# Patient Record
Sex: Male | Born: 1960 | Race: White | Hispanic: No | Marital: Married | Smoking: Former smoker
Health system: Southern US, Community
[De-identification: ages and names within clinical notes are randomized; demographics above are authoritative.]

## PROBLEM LIST (undated history)

## (undated) HISTORY — PX: FINGER SURGERY: SHX640

---

## 2007-07-23 ENCOUNTER — Observation Stay (HOSPITAL_COMMUNITY): Admission: EM | Admit: 2007-07-23 | Discharge: 2007-07-24 | Payer: Self-pay | Admitting: Family Medicine

## 2007-07-23 ENCOUNTER — Ambulatory Visit: Payer: Self-pay | Admitting: Family Medicine

## 2009-08-05 IMAGING — CT CT ABDOMEN W/ CM
2 of 5 series · 16 of 46 positions shown, 18 images · IV contrast (APPLIED)
Comparison: None

CT ABDOMEN

CLINICAL DATA: Abdominal cramping.  Elevated white count.  Left
lower quadrant pain.  Nausea, vomiting, diaphoresis.  No prior
surgery.

CT ABDOMEN AND PELVIS WITH CONTRAST
TECHNIQUE: Multidetector CT imaging of the abdomen and pelvis was
performed using the standard protocol following bolus
administration of intravenous contrast.
Contrast: 100 ml Gmnipaque-IVV.

[Series 2: abd/pelv with 5.0 b31f st · axial · 0.71mm/px · z∈[-472,-48]mm · 13 of 95 slices shown, 15 images]
[im 5/95  soft-tissue]
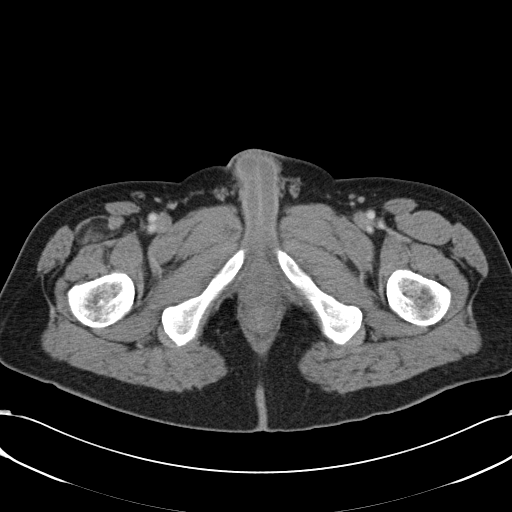
[im 5/95  bone]
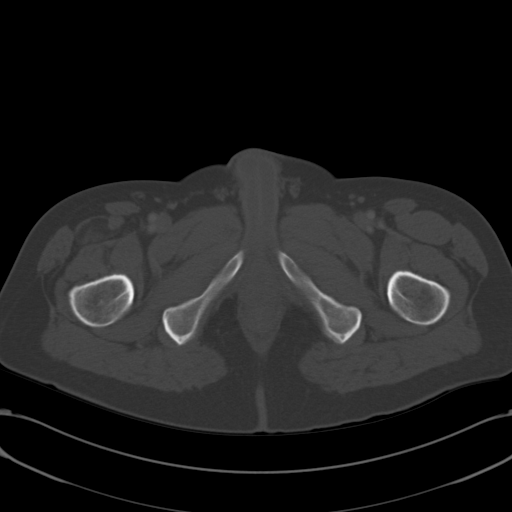
[im 15/95  soft-tissue]
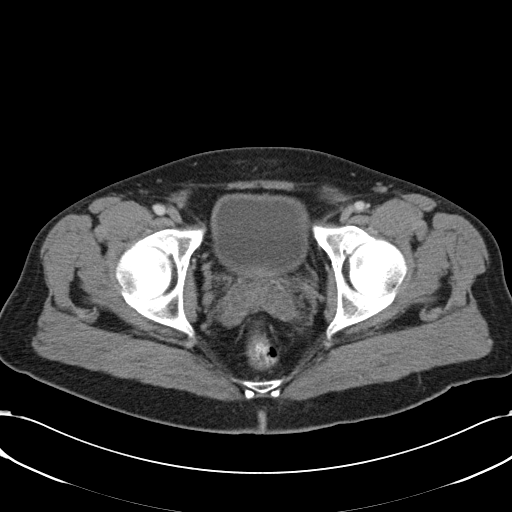
[im 19/95  soft-tissue]
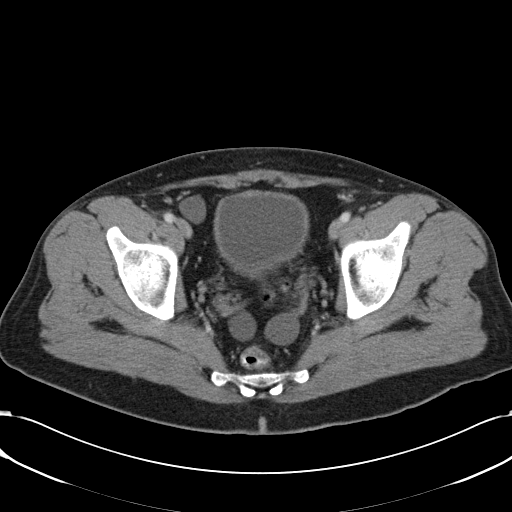
[im 29/95  soft-tissue]
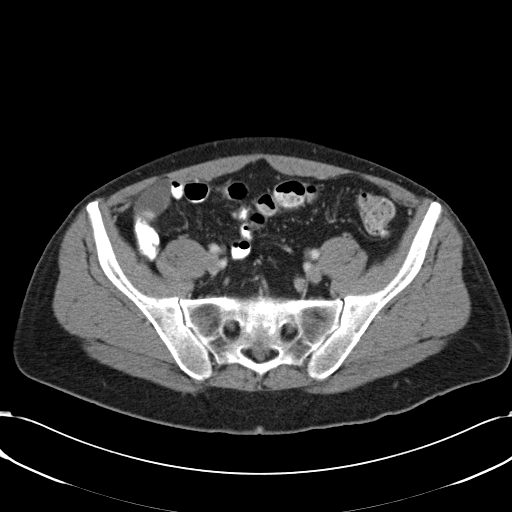
[im 33/95  soft-tissue]
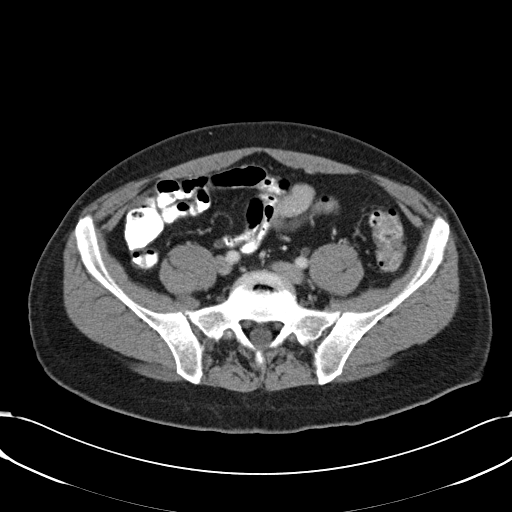
[im 43/95  soft-tissue]
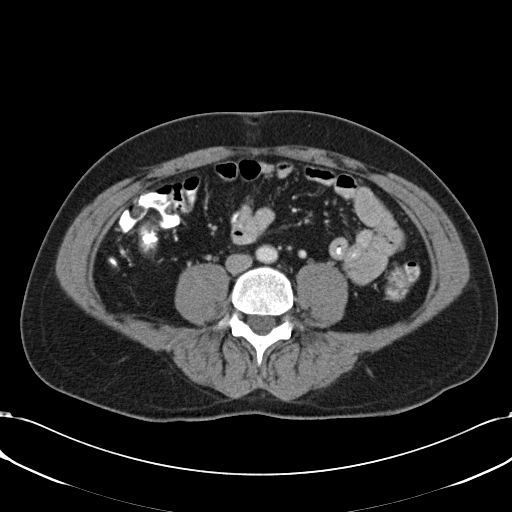
[im 48/95  soft-tissue]
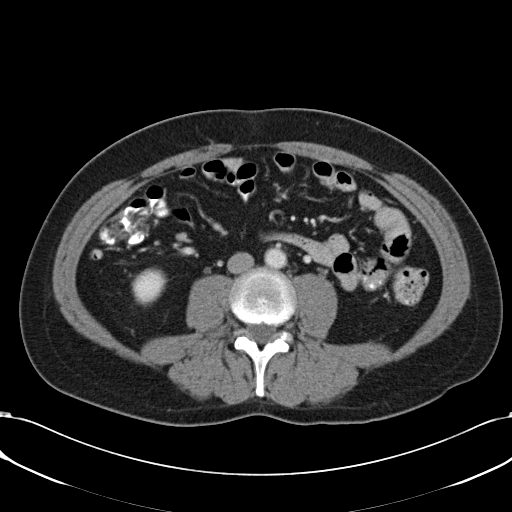
[im 52/95  soft-tissue]
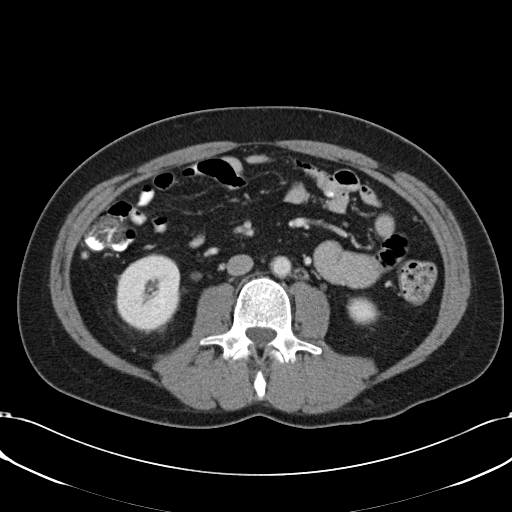
[im 62/95  soft-tissue]
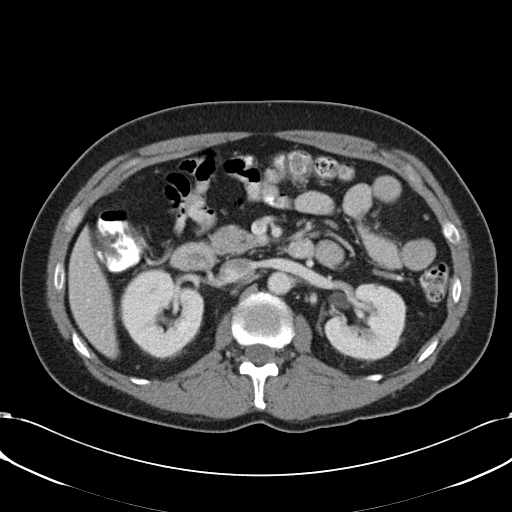
[im 62/95  bone]
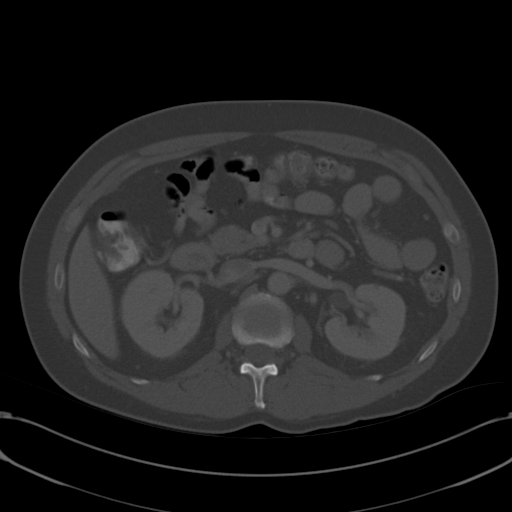
[im 66/95  soft-tissue]
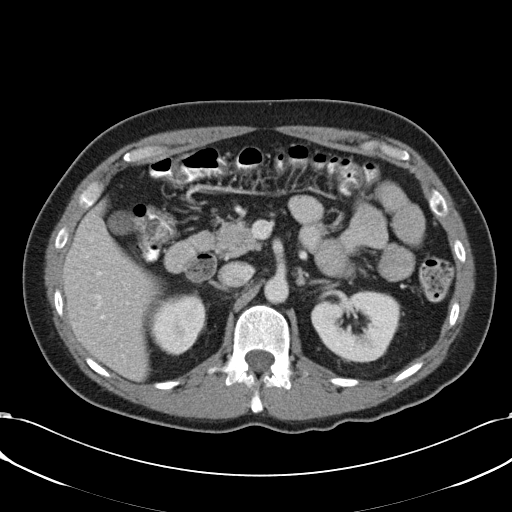
[im 76/95  soft-tissue]
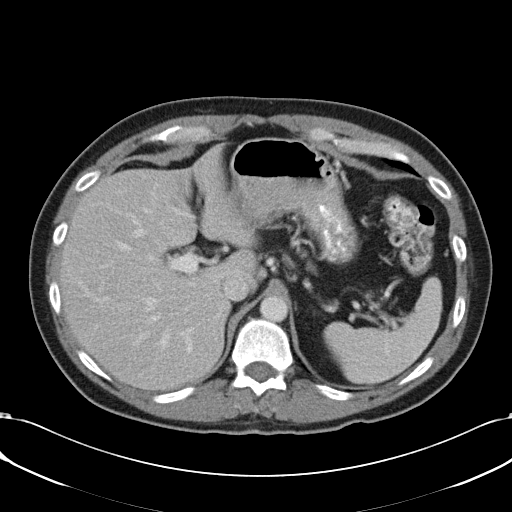
[im 80/95  soft-tissue]
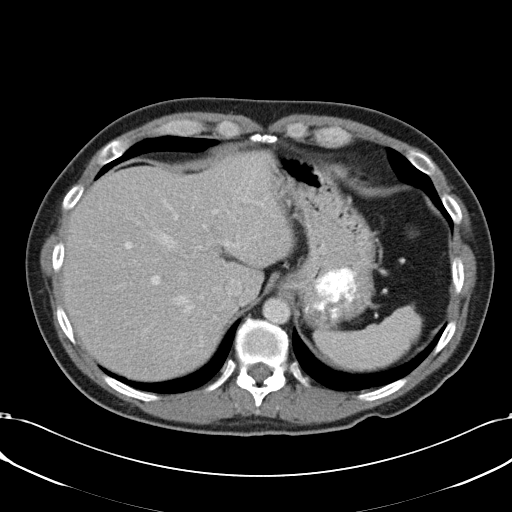
[im 90/95  soft-tissue]
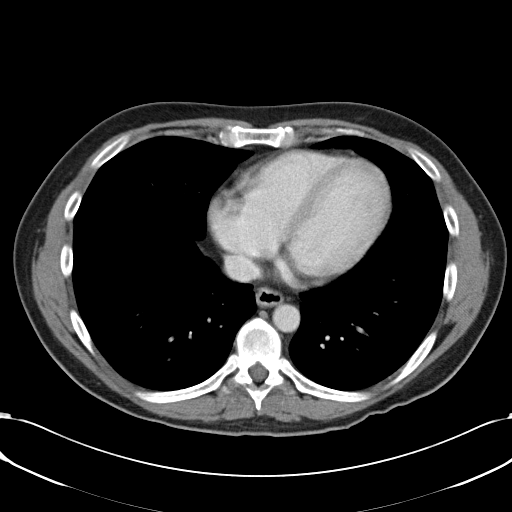

[Series 5: abd/pelv with 4.0 spo cor st · coronal · 0.93mm/px · 3 of 52 slices shown]
[im 18/52  soft-tissue]
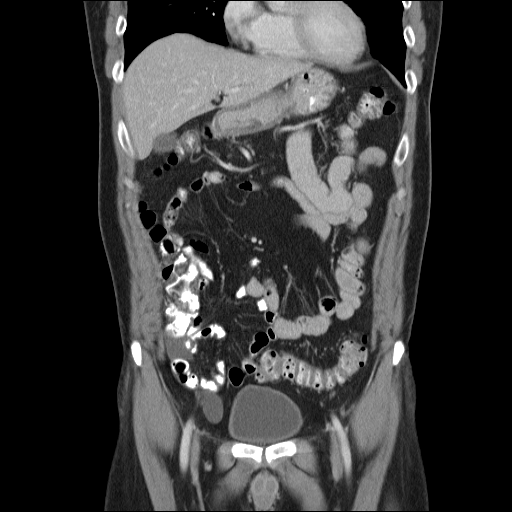
[im 23/52  soft-tissue]
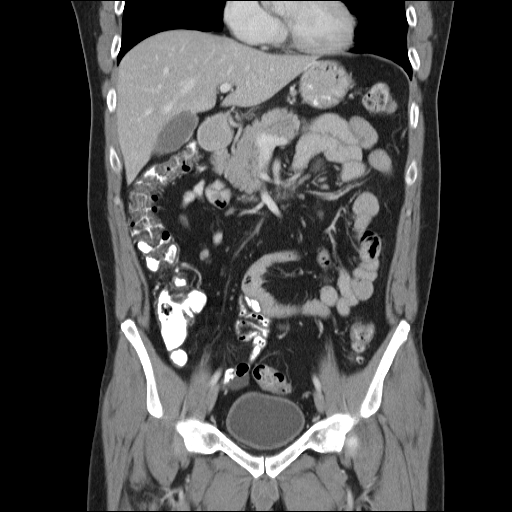
[im 29/52  soft-tissue]
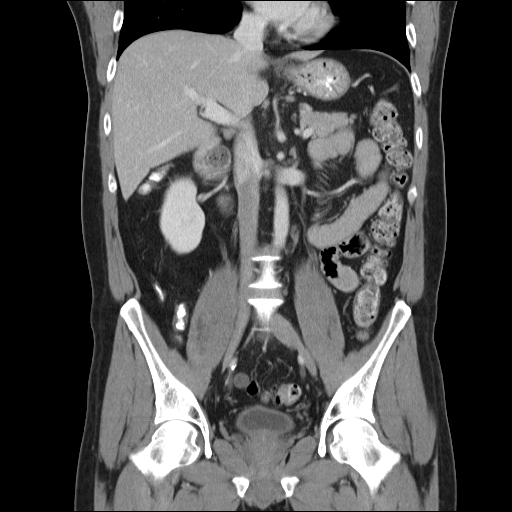

[16 of 46 positions shown; findings below may reference images not displayed]

FINDINGS: Images of the lung bases are unremarkable.  No focal
abnormality is identified within the liver, spleen, pancreas,
adrenal glands, or kidneys.  The appendix is well seen and has a
normal appearance.  Gallbladder is present.  There is no
retroperitoneal adenopathy.
IMPRESSION: No evidence for acute abnormality of the abdomen

CT PELVIS
FINDINGS: Throughout the pelvis, there are multiple well-defined
collections of low attenuation fluid.  Versus 4.0 x 2.0 cm in the
region of the terminal ileum.  This is clearly separate from the
appendix.  A second is seen in the region of the right pelvic side
wall and measures 2.2 x 5.1 cm.  Three additional similar
collections are identified in the central posterior pelvis,
measuring 2.6, 2.8, and 1.8 cm.  These are in close proximity to
the seminal vesicles but appears similar to the other lesions which
are not adjacent to the seminal vesicles.  The patient has had
numerous diverticula but these collections of fluid are not in
close proximity to diverticular disease to suggest that they
represent diverticular abscess.  There is no inflammatory change to
suggest that these are related to abscess these of other origin.
The prostate appears enlarged.  There is no pelvic adenopathy.
IMPRESSION: A discrete simple appearing fluid collections within the pelvis as
described.  The etiology of these is unknown.  These are felt to be
related to peritoneal inclusion cysts, lymphoceles, which are more
common in patients with prior surgery.  Seminal vesicle cysts
representing abscesses.

## 2010-06-03 NOTE — Discharge Summary (Signed)
Joe Cross, Joe Cross               ACCOUNT NO.:  [id number]   MEDICAL RECORD NO.:  [id number]          PATIENT TYPE:  INP   LOCATION:  5123                         FACILITY:  [facility]   PHYSICIAN:  Leighton Roach McDiarmid, M.D.DATE OF BIRTH:  Aug 03, 1960   DATE OF ADMISSION:  07/23/2007  DATE OF DISCHARGE:  07/24/2007                               DISCHARGE SUMMARY   PRIMARY CARE PHYSICIAN:  [facility].   REASON FOR ADMISSION:  Abdominal pain.   DISCHARGE DIAGNOSES:  1. Abdominal pain, rule out diverticular disease.  2. Borderline hypertension.   DISCHARGE MEDICATIONS:  1. Cipro 500 mg p.o. b.i.d. x7 days.  2. Flagyl 500 mg p.o. t.i.d x7 days.  3. MiraLax 1 capful p.o. daily p.r.n. constipation.   CONSULTS:  None.   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  By CT appearance, the  radiologist did not favor abscesses.  The appendix appeared normal and  the patient did have numerous diverticula, but none of the collections  of fluid were in close proximity to the diverticular disease to suggest  an abscess.  The prostate was enlarged.   LABORATORY DATA:  Comprehensive metabolic panel was within normal limits  with a creatinine of 1.1, total bilirubin 1.0, alkaline phosphatase 59,  AST 41, ALT 30, total protein 5.9, albumin 3.3, and calcium 8.5.  CBC on  admission revealed a white blood cell count of 18.1, hemoglobin 18.0,  hematocrit 52.7, and platelet count 295 with an ANC of 13.3, and after  some IV fluids, trended to a white blood cell count 14.9, hemoglobin  15.1, hematocrit 44.7, and platelet count 251.  Fecal occult blood was  negative.  Lipase was 28.   HOSPITAL COURSE:   Abdominal pain.  The patient was treated initially  with IV antibiotics for presumed diverticulitis.  A CT exam was  inconsistent with this, and he was continued on his medications with  improvement regardless.  His abdominal pain improved significantly  overnight with some IV hydrations as well as his vomiting.  The patient  felt significantly better by the day of discharge and thus he was felt  stable for discharge with close followup with his primary care  physician.  He was continued on his Cipro and Flagyl for concern that  there perhaps could be diverticular disease resulting in these  abscesses.  The thought was that these abscesses persisted where the  patient developed a higher white count on recheck that perhaps these  cysts should be further evaluated with aspirations as an outpatient  procedure.  The patient was in agreement with this plan and felt stable  for discharge.   INSTRUCTIONS AND FOLLOWUP:  The patient has a followup scheduled on the  [facility], phone number [phone] on July 27, 2007, at 1:45 p.m..  He is to follow  low-sodium, heart-healthy diet and has no restrictions  with regard to his activity.  He is to stop smoking, and again as noted  above, have his white blood cell count checked, and consider a repeat CT  of the abdomen in the near future for followup of these cysts.      Ancil Boozer, MD  Electronically Signed      Leighton Roach McDiarmid, M.D.  Electronically Signed    SA/MEDQ  D:  07/24/2007  T:  07/25/2007  Job:  [id number]

## 2010-06-06 NOTE — H&P (Signed)
Joe Cross, Joe Cross               ACCOUNT NO.:  [id number]   MEDICAL RECORD NO.:  [id number]          PATIENT TYPE:  OBV   LOCATION:  5123                         FACILITY:  [facility]   PHYSICIAN:  Cat Ta, MD             DATE OF BIRTH:  [birth year]   DATE OF ADMISSION:  07/23/2007  DATE OF DISCHARGE:  07/24/2007                              HISTORY & PHYSICAL   ADDENDUM   ALLERGIES:  No known allergies.   MEDICATIONS:  Over-the-counter vitamin and over-the-counter pain  medication as needed.      Angeline Slim, MD  Electronically Signed     CT/MEDQ  D:  07/25/2007  T:  07/26/2007  Job:  [id number]

## 2010-06-06 NOTE — H&P (Signed)
NAME:  Joe Cross, Joe Cross               ACCOUNT NO.:  [id number]   MEDICAL RECORD NO.:  [id number]          PATIENT TYPE:  OBV   LOCATION:  5123                         FACILITY:  [facility]   PHYSICIAN:  Leighton Roach McDiarmid, M.D.DATE OF BIRTH:  [birth year]   DATE OF ADMISSION:  07/23/2007  DATE OF DISCHARGE:  07/24/2007                              HISTORY & PHYSICAL   PRIMARY CARE PHYSICIAN:  None.   The patient came to [place] from [facility].   CHIEF COMPLAINT:  Crampy abdominal pain.   HISTORY OF PRESENT ILLNESS:  The patient complains of crampy left upper  abdominal pain that started last Sunday.  He states that he has  productive cough with pinkish bloody discharge.  On Monday, the patient  took magnesium citrate and had a bowel movement that was soft.  The  following day the patient vomited, foul smelling vomitus, 9 times.  The  vomitus was not bloody and brown in color.  The abdominal pain was also  present at this time.  He did not eat or drink anything on Tuesday.  He  felt better on Thursday, but he started having an appetite again on  Friday and was able to hold down yogurt and ginger ale.  He has not had  a bowel movements since Wednesday and he states that usually he has a  bowel movement regularly with 1 bowel movement everyday, also endorses  flatulence.  The patient did take over-the-counter pain medicine called  Pain-Aid, which did help some of the pain.  Denies fever, chills.   PAST SURGICAL HISTORY:  Right index finger surgery as a teenager,  circumcision 22 years ago.   SOCIAL HISTORY:  The patient lives with his wife and mother.  He works  at [employer].  He smokes 1 pack of cigarette a day times 20 years.  Endorses Alcohol, he drinks about a 6-pack of beer a week.  He does not  drink liquor.  He denies recreational drug use.   FAMILY HISTORY:  Denies any medical history on his mother, his father  died of colon cancer at age 53.  His siblings do not have any health  problems.   REVIEW OF SYSTEMS:  Generally, he has some chills, sweats and states he  has lost 10 pounds in this past week.  He also has some vomiting,  abdominal pain.  The other review of systems, see HPI.   PHYSICAL EXAMINATION:  VITAL SIGNS:  Temp 98.7, pulse 86, respiratory  rate 16, blood pressure 141/101, and pulse ox was 96% on room air.  GENERAL:  The patient is in no apparent distress.  He is alert, oriented  x3.  HEENT:  Moist mucous membrane.  CARDIOVASCULAR:  Regular rate and rhythm.  No murmurs.  LUNGS:  Clear to auscultation bilaterally.  No wheezing.  ABDOMEN:  Mild left lower quadrant tenderness to palpation.  No  guarding.  No rebound.  Hypoactive bowel sounds x4.  GU:  No inguinal nodes to palpation.  Hemoccult negative.   ADMISSION LABS:  Sodium 134, potassium 3.4, chloride 91, bicarb 33, BUN  24,  creatinine 1.23, and glucose 111.  Total bilirubin 1.5, alkaline  phosphatase 77, AST 41, ALT 32, total protein 7.7, albumin 4.3, and  calcium 9.5.  CBC with white blood cell 18.1, hemoglobin 18.0,  hematocrit 52.7, and platelets 295 with 74% neutrophils, 15% lymphocyte,  11% monocytes, eosinophil 1%, and basophil 0.  Lipase 28, hematocrit  negative.   ASSESSMENT/PLAN:  This is a 50 year old Caucasian male with left lower  quadrant pain x1 week and vomiting x2 days.  The differential for  abdominal pain include diverticulitis, abscess, pancreatitis,  obstruction, PUD.  Given increased white blood cell may be  diverticulitis, abscess, or peritonitis.   1. Abdominal pain with creatinine of 1.23.  We decided to order a CT      of the abdomen and pelvis with p.o. and IV contrast.  The patient      is n.p.o. for now.  Empiric antibiotics was given with Cipro and      Flagyl.  CBC in the morning.   1. Decreased potassium, K-Dur 40 mEq p.o. x1.  We will follow BMET in      morning.   1. Creatinine is 1.23.  The patient has been vomiting and had      decreased appetite and  liquid/food consumption.  We do not know his      baseline.  He may be dehydrated, 1 L bolus now and D5 with 1/2 NS      at 125 mL per hour.  BMET in morning.   1. Increased white count.  The patient has been afebrile, elevated      white blood cell may be due in part to dehydration status, but with      the number of 18.1 infection/inflammation are differentials.  I      have given him Flagyl to cover for diverticulitis, abscess,      peritonitis, anaerobic causes.  CBC in a.m.   1. Increased blood pressure.  The patient may have hypertension, but      he has not been seen and/or diagnosed for this condition.  We will      monitor his blood pressure overnight and treat accordingly.   1. FEN/GI.  1 L NS bolus now.  Then D5 1/2 NS at 125cc/hr.  NPO until      abdominal CT.   1. Prophylaxis.  Early ambulation.   1. Disposition.  Pending abdominal CT and labs.      Angeline Slim, MD  Electronically Signed      Leighton Roach McDiarmid, M.D.  Electronically Signed    CT/MEDQ  D:  07/25/2007  T:  07/26/2007  Job:  [id number]

## 2010-10-16 LAB — COMPREHENSIVE METABOLIC PANEL
ALT: 32
Albumin: 3.3 — ABNORMAL LOW
Albumin: 4.3
Alkaline Phosphatase: 77
BUN: 17
Chloride: 101
Creatinine, Ser: 1.1
Glucose, Bld: 112 — ABNORMAL HIGH
Potassium: 3.4 — ABNORMAL LOW
Sodium: 134 — ABNORMAL LOW
Total Bilirubin: 1
Total Protein: 7.7

## 2010-10-16 LAB — CBC
HCT: 44.7
MCV: 94.7
Platelets: 251
Platelets: 295
RDW: 12.4
RDW: 12.8
WBC: 14.9 — ABNORMAL HIGH

## 2010-10-16 LAB — DIFFERENTIAL
Basophils Relative: 0
Eosinophils Absolute: 0.1
Monocytes Absolute: 2 — ABNORMAL HIGH
Monocytes Relative: 11

## 2011-08-16 ENCOUNTER — Ambulatory Visit: Payer: Self-pay | Admitting: Family Medicine

## 2011-08-16 VITALS — BP 138/96 | HR 94 | Temp 98.3°F | Resp 16 | Ht 68.0 in | Wt 179.0 lb

## 2011-08-16 DIAGNOSIS — R21 Rash and other nonspecific skin eruption: Secondary | ICD-10-CM

## 2011-08-16 DIAGNOSIS — L0291 Cutaneous abscess, unspecified: Secondary | ICD-10-CM

## 2011-08-16 DIAGNOSIS — L0390 Cellulitis, unspecified: Secondary | ICD-10-CM

## 2011-08-16 MED ORDER — METHYLPREDNISOLONE SODIUM SUCC 125 MG IJ SOLR
125.0000 mg | Freq: Once | INTRAMUSCULAR | Status: AC
  Administered 2011-08-16: 125 mg via INTRAMUSCULAR

## 2011-08-16 MED ORDER — CEPHALEXIN 500 MG PO CAPS: 500.0000 mg | ORAL_CAPSULE | Freq: Four times a day (QID) | ORAL | Status: AC

## 2011-08-16 MED ORDER — METHYLPREDNISOLONE 4 MG PO KIT: PACK | ORAL | Status: AC

## 2011-08-16 NOTE — Progress Notes (Signed)
[facility]:  Office Visit  Chief Complaint:  Chief Complaint  Patient presents with  . Rash    * 4 days- Bil arms, Bil legs, and torso    HPI: Joe Cross is a 51 y.o. male who complains of  4 day h/o rash which started on his left arm and now has srpead. Worked in pool and cleared some bushes. Tried cortisone OTC and abx cream without releif. Spreading very fast. No CP. No SOB.   History reviewed. No pertinent past medical history. Past Surgical History  Procedure Date  . Finger surgery    History   Social History  . Marital Status: Married    Spouse Name: N/A    Number of Children: N/A  . Years of Education: N/A   Social History Main Topics  . Smoking status: Former Smoker    Quit date: 08/16/2007  . Smokeless tobacco: None  . Alcohol Use: None  . Drug Use: None  . Sexually Active: None   Other Topics Concern  . None   Social History Narrative  . None   Family History  Problem Relation Age of Onset  . Cancer Mother    No Known Allergies Prior to Admission medications   Not on File     ROS: The patient denies fevers, chills, night sweats, unintentional weight loss, chest pain, palpitations, wheezing, dyspnea on exertion, nausea, vomiting, abdominal pain, dysuria, hematuria, melena, numbness, weakness, or tingling.  All other systems have been reviewed and were otherwise negative with the exception of those mentioned in the HPI and as above.    PHYSICAL EXAM: Filed Vitals:   08/16/11 1525  BP: 138/96  Pulse: 94  Temp: 98.3 F (36.8 C)  Resp: 16   Filed Vitals:   08/16/11 1525  Height: 5\' 8"  (1.727 m)  Weight: 179 lb (81.194 kg)   Body mass index is 27.22 kg/(m^2).  General: Alert, no acute distress HEENT:  Normocephalic, atraumatic, oropharynx patent.  Cardiovascular:  Regular rate and rhythm, no rubs murmurs or gallops.  No Carotid bruits, radial pulse intact. No pedal edema.  Respiratory: Clear to auscultation  bilaterally.  No wheezes, rales, or rhonchi.  No cyanosis, no use of accessory musculature GI: No organomegaly, abdomen is soft and non-tender, positive bowel sounds.  No masses. Skin: + rashes. Erythema, excoriation, + warmth, diffuse Neurologic: Facial musculature symmetric. Psychiatric: Patient is appropriate throughout our interaction. Lymphatic: No cervical lymphadenopathy Musculoskeletal: Gait intact.   LABS: Results for orders placed during the hospital encounter of 07/23/07  CBC      Component Value Range   WBC 18.1 (*)    RBC 5.59     Hemoglobin 18.0 (*)    HCT 52.7 (*)    MCV 94.3     MCHC 34.2     RDW 12.8     Platelets 295    COMPREHENSIVE METABOLIC PANEL      Component Value Range   Sodium 134 (*)    Potassium 3.4 (*)    Chloride 91 (*)    CO2 33 (*)    Glucose, Bld 111 (*)    BUN 24 (*)    Creatinine, Ser 1.23     Calcium 9.5     Total Protein 7.7     Albumin 4.3     AST 41 (*)    ALT 32     Alkaline Phosphatase 77     Total Bilirubin 1.5 (*)    GFR calc  non Af Amer >60     GFR calc Af Amer       Value: >60            The eGFR has been calculated     using the MDRD equation.     This calculation has not been     validated in all clinical  DIFFERENTIAL      Component Value Range   Neutrophils Relative 74     Neutro Abs 13.3 (*)    Lymphocytes Relative 15     Lymphs Abs 2.7     Monocytes Relative 11     Monocytes Absolute 2.0 (*)    Eosinophils Relative 1     Eosinophils Absolute 0.1     Basophils Relative 0     Basophils Absolute 0.0    LIPASE, BLOOD      Component Value Range   Lipase 28    OCCULT BLOOD X 1 CARD TO LAB, STOOL      Component Value Range   Fecal Occult Bld NEGATIVE    CBC      Component Value Range   WBC 14.9 (*)    RBC 4.72     Hemoglobin 15.1     HCT 44.7     MCV 94.7     MCHC 33.8     RDW 12.4     Platelets 251    COMPREHENSIVE METABOLIC PANEL      Component Value Range   Sodium 138     Potassium 4.0      Chloride 101     CO2 30     Glucose, Bld 112 (*)    BUN 17     Creatinine, Ser 1.10     Calcium 8.5     Total Protein 5.9 (*)    Albumin 3.3 (*)    AST 41 (*)    ALT 30     Alkaline Phosphatase 59     Total Bilirubin 1.0     GFR calc non Af Amer >60     GFR calc Af Amer       Value: >60            The eGFR has been calculated     using the MDRD equation.     This calculation has not been     validated in all clinical     EKG/XRAY:   Primary read interpreted by Dr. Conley Rolls at [facility].   ASSESSMENT/PLAN: Encounter Diagnoses  Name Primary?  . Rash Yes  . Cellulitis    Most likely poison ivy dermatitis with cellulitis  Solumedrol Medrol Dose pack Keflex OTC calamine, anithistamine    Sabastian Raimondi PHUONG, DO 08/16/2011 4:49 PM

## 2016-06-14 ENCOUNTER — Emergency Department (HOSPITAL_COMMUNITY)
Admission: EM | Admit: 2016-06-14 | Discharge: 2016-06-14 | Disposition: A | Discharge status: Home / Self Care | Payer: Self-pay | Attending: Emergency Medicine | Admitting: Emergency Medicine

## 2016-06-14 ENCOUNTER — Encounter (HOSPITAL_COMMUNITY): Payer: Self-pay | Admitting: Emergency Medicine

## 2016-06-14 DIAGNOSIS — Y999 Unspecified external cause status: Secondary | ICD-10-CM | POA: Insufficient documentation

## 2016-06-14 DIAGNOSIS — Z87891 Personal history of nicotine dependence: Secondary | ICD-10-CM | POA: Insufficient documentation

## 2016-06-14 DIAGNOSIS — Y9389 Activity, other specified: Secondary | ICD-10-CM | POA: Insufficient documentation

## 2016-06-14 DIAGNOSIS — T22111A Burn of first degree of right forearm, initial encounter: Secondary | ICD-10-CM | POA: Insufficient documentation

## 2016-06-14 DIAGNOSIS — T300 Burn of unspecified body region, unspecified degree: Secondary | ICD-10-CM

## 2016-06-14 DIAGNOSIS — Y929 Unspecified place or not applicable: Secondary | ICD-10-CM | POA: Insufficient documentation

## 2016-06-14 DIAGNOSIS — X16XXXA Contact with hot heating appliances, radiators and pipes, initial encounter: Secondary | ICD-10-CM | POA: Insufficient documentation

## 2016-06-14 MED ORDER — IBUPROFEN 800 MG PO TABS
800.0000 mg | ORAL_TABLET | Freq: Once | ORAL | Status: AC
  Administered 2016-06-14: 800 mg via ORAL
  Filled 2016-06-14: qty 1

## 2016-06-14 MED ORDER — SILVER SULFADIAZINE 1 % EX CREA
TOPICAL_CREAM | Freq: Once | CUTANEOUS | Status: AC
  Administered 2016-06-14: 09:00:00 via TOPICAL
  Filled 2016-06-14: qty 50
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
## 2019-09-27 ENCOUNTER — Emergency Department (HOSPITAL_COMMUNITY)
Admission: EM | Admit: 2019-09-27 | Discharge: 2019-09-28 | Disposition: A | Discharge status: Home / Self Care | Payer: 59 | Attending: Emergency Medicine | Admitting: Emergency Medicine

## 2019-09-27 ENCOUNTER — Emergency Department (HOSPITAL_COMMUNITY): Payer: 59

## 2019-09-27 ENCOUNTER — Other Ambulatory Visit: Payer: Self-pay

## 2019-09-27 DIAGNOSIS — R0789 Other chest pain: Secondary | ICD-10-CM | POA: Diagnosis present

## 2019-09-27 DIAGNOSIS — Z87891 Personal history of nicotine dependence: Secondary | ICD-10-CM | POA: Diagnosis not present

## 2019-09-27 LAB — CBC
HCT: 51 % (ref 39.0–52.0)
Hemoglobin: 16.8 g/dL (ref 13.0–17.0)
MCH: 30.7 pg (ref 26.0–34.0)
MCHC: 32.9 g/dL (ref 30.0–36.0)
MCV: 93.2 fL (ref 80.0–100.0)
Platelets: 317 10*3/uL (ref 150–400)
RBC: 5.47 MIL/uL (ref 4.22–5.81)
RDW: 12 % (ref 11.5–15.5)
WBC: 15.2 10*3/uL — ABNORMAL HIGH (ref 4.0–10.5)
nRBC: 0 % (ref 0.0–0.2)

## 2019-09-27 LAB — BASIC METABOLIC PANEL
Anion gap: 11 (ref 5–15)
BUN: 11 mg/dL (ref 6–20)
CO2: 26 mmol/L (ref 22–32)
Calcium: 9.4 mg/dL (ref 8.9–10.3)
Chloride: 100 mmol/L (ref 98–111)
Creatinine, Ser: 1.09 mg/dL (ref 0.61–1.24)
GFR calc Af Amer: >60 mL/min (ref >60)
GFR calc non Af Amer: >60 mL/min (ref >60)
Glucose, Bld: 94 mg/dL (ref 70–99)
Potassium: 4.6 mmol/L (ref 3.5–5.1)
Sodium: 137 mmol/L (ref 135–145)

## 2019-09-27 LAB — TROPONIN I (HIGH SENSITIVITY)
Troponin I (High Sensitivity): 58 ng/L — ABNORMAL HIGH (ref <18)
Troponin I (High Sensitivity): 58 ng/L — ABNORMAL HIGH (ref <18)

## 2019-09-27 NOTE — ED Triage Notes (Signed)
Pt here from urgent care for further eval of left sided non-radiating chest pain x 3-4 days. Pain seems to be worse approx two hours after eating, especially if he lays down. Denies n/v or shortness of breath.

## 2019-09-28 LAB — TROPONIN I (HIGH SENSITIVITY): Troponin I (High Sensitivity): 35 ng/L — ABNORMAL HIGH (ref <18)

## 2019-09-28 MED ORDER — OMEPRAZOLE 20 MG PO CPDR: 20.0000 mg | DELAYED_RELEASE_CAPSULE | Freq: Two times a day (BID) | ORAL | 0 refills | Status: AC

## 2019-09-28 NOTE — ED Provider Notes (Signed)
[facility] EMERGENCY DEPARTMENT Provider Note   CSN: [id number] Arrival date & time: 09/27/19  1726     History Chief Complaint  Patient presents with  . Chest Pain    Joe Cross is a 59 y.o. male.  The history is provided by the patient and medical records. No language interpreter was used.  Chest Pain    59 year old male who is a former smoker sent here from urgent care for evaluation of chest pain.  Patient report for the past 4 days he has been experiencing intermittent chest pain.  Pain is located in his left upper chest, pain is described as a tightness pressure sensation usually brought on after he eats any meal.  Described pain as lasting for about 30 minutes each time, improves when he takes a deep breath or when he walks around and worse when he lay flat.  Pain is also improved when he takes Maalox, or Mylanta.  He denies any exertional chest pain any associated nausea diaphoresis shortness of breath lightheadedness or dizziness with these episodes.  He denies any prior history of PE or DVT no recent surgery or prolonged bedrest active cancer or hemoptysis.  He is currently chest pain-free.  Denies any strong family history of heart disease.  He is fully vaccinated for COVID-19.  He does not have a primary care provider.  No past medical history on file.  There are no problems to display for this patient.   Past Surgical History:  Procedure Laterality Date  . FINGER SURGERY         Family History  Problem Relation Age of Onset  . Cancer Mother     Social History   Tobacco Use  . Smoking status: Former Smoker    Quit date: 08/16/2007    Years since quitting: 12.1  Substance Use Topics  . Alcohol use: Not on file  . Drug use: Not on file    Home Medications Prior to Admission medications   Not on File    Allergies    Patient has no known allergies.  Review of Systems   Review of Systems  Cardiovascular: Positive for chest pain.   All other systems reviewed and are negative.   Physical Exam Updated Vital Signs BP (!) 141/89 (BP Location: Right Arm)   Pulse 64   Temp 98.2 F (36.8 C) (Oral)   Resp 14   SpO2 96%   Physical Exam Vitals and nursing note reviewed.  Constitutional:      General: He is not in acute distress.    Appearance: He is well-developed.  HENT:     Head: Atraumatic.  Eyes:     Conjunctiva/sclera: Conjunctivae normal.  Cardiovascular:     Rate and Rhythm: Normal rate and regular rhythm.     Heart sounds: Normal heart sounds.  Pulmonary:     Effort: Pulmonary effort is normal.     Breath sounds: Normal breath sounds. No decreased breath sounds, wheezing or rhonchi.  Chest:     Chest wall: No tenderness.  Abdominal:     Palpations: Abdomen is soft.  Musculoskeletal:        General: Normal range of motion.     Cervical back: Neck supple.  Skin:    Findings: No rash.  Neurological:     Mental Status: He is alert and oriented to person, place, and time.     ED Results / Procedures / Treatments   Labs (all labs ordered are listed, but  only abnormal results are displayed) Labs Reviewed  CBC - Abnormal; Notable for the following components:      Result Value   WBC 15.2 (*)    All other components within normal limits  TROPONIN I (HIGH SENSITIVITY) - Abnormal; Notable for the following components:   Troponin I (High Sensitivity) 58 (*)    All other components within normal limits  TROPONIN I (HIGH SENSITIVITY) - Abnormal; Notable for the following components:   Troponin I (High Sensitivity) 58 (*)    All other components within normal limits  TROPONIN I (HIGH SENSITIVITY) - Abnormal; Notable for the following components:   Troponin I (High Sensitivity) 35 (*)    All other components within normal limits  BASIC METABOLIC PANEL  TROPONIN I (HIGH SENSITIVITY)    EKG None   Date: 09/28/2019  Rate: 75  Rhythm: normal sinus rhythm  QRS Axis: normal  Intervals: normal   ST/T Wave abnormalities: normal  Conduction Disutrbances: none  Narrative Interpretation:   Old EKG Reviewed: No significant changes noted     Radiology DG Chest 2 View  Result Date: 09/27/2019 CLINICAL DATA:  Left-sided chest pain. EXAM: CHEST - 2 VIEW COMPARISON:  None. FINDINGS: The cardiac silhouette, mediastinal and hilar contours are within normal limits. The lungs are clear. No pleural effusion. No pulmonary lesions. The bony thorax is intact. IMPRESSION: No acute cardiopulmonary findings. Electronically Signed   By: Rudie Meyer M.D.   On: 09/27/2019 18:54    Procedures Procedures (including critical care time)  Medications Ordered in ED Medications - No data to display  ED Course  I have reviewed the triage vital signs and the nursing notes.  Pertinent labs & imaging results that were available during my care of the patient were reviewed by me and considered in my medical decision making (see chart for details).    MDM Rules/Calculators/A&P                          BP (!) 141/89 (BP Location: Right Arm)   Pulse 64   Temp 98.2 F (36.8 C) (Oral)   Resp 14   SpO2 96%   Final Clinical Impression(s) / ED Diagnoses Final diagnoses:  Atypical chest pain     Rx / DC Orders ED Discharge Orders         Ordered    omeprazole (PRILOSEC) 20 MG capsule  2 times daily before meals        09/28/19 1139         9:10 AM Patient presents with chest pain atypical of ACS.  Pain is more suggestive of gastric reflux as it is normally brought on by eating.  No active chest pain at this time.  He does have mild elevated troponin of 58.  Will obtain delta troponin.  EKG is normal.  Doubt PE.  He does have an elevated white count of 15.2 however he does not present with any infectious symptoms at this time.  He has had leukocytosis in the past.  Chest x-ray unremarkable.  Patient also without any cold symptoms to suggest COVID-19.  He is also fully vaccinated for  COVID-19.  11:34 AM I obtain another troponin and it is improved from prior.  It is now 35.  Patient remained symptom-free.  Will prescribe PPI and encourage patient to follow-up outpatient for further evaluation.  Strict return precaution given.   Fayrene Helper, PA-C 09/28/19 1140    Arby Barrette, MD  09/29/19 0815  

## 2019-09-28 NOTE — Discharge Instructions (Signed)
You have symptoms suggestive of heartburn.  Please take Prilosec 30 minutes before each major meals for the next 2 weeks.  Use number below to find a primary care provider for further management of your health.  Return to the ER promptly if your symptoms worsen or if you have any other concern.

## 2021-10-10 IMAGING — CR DG CHEST 2V
2 series · 2 of 2 positions shown · non-contrast
Comparison: None.

CLINICAL DATA: Left-sided chest pain.

EXAM:
CHEST - 2 VIEW

[chest pa]
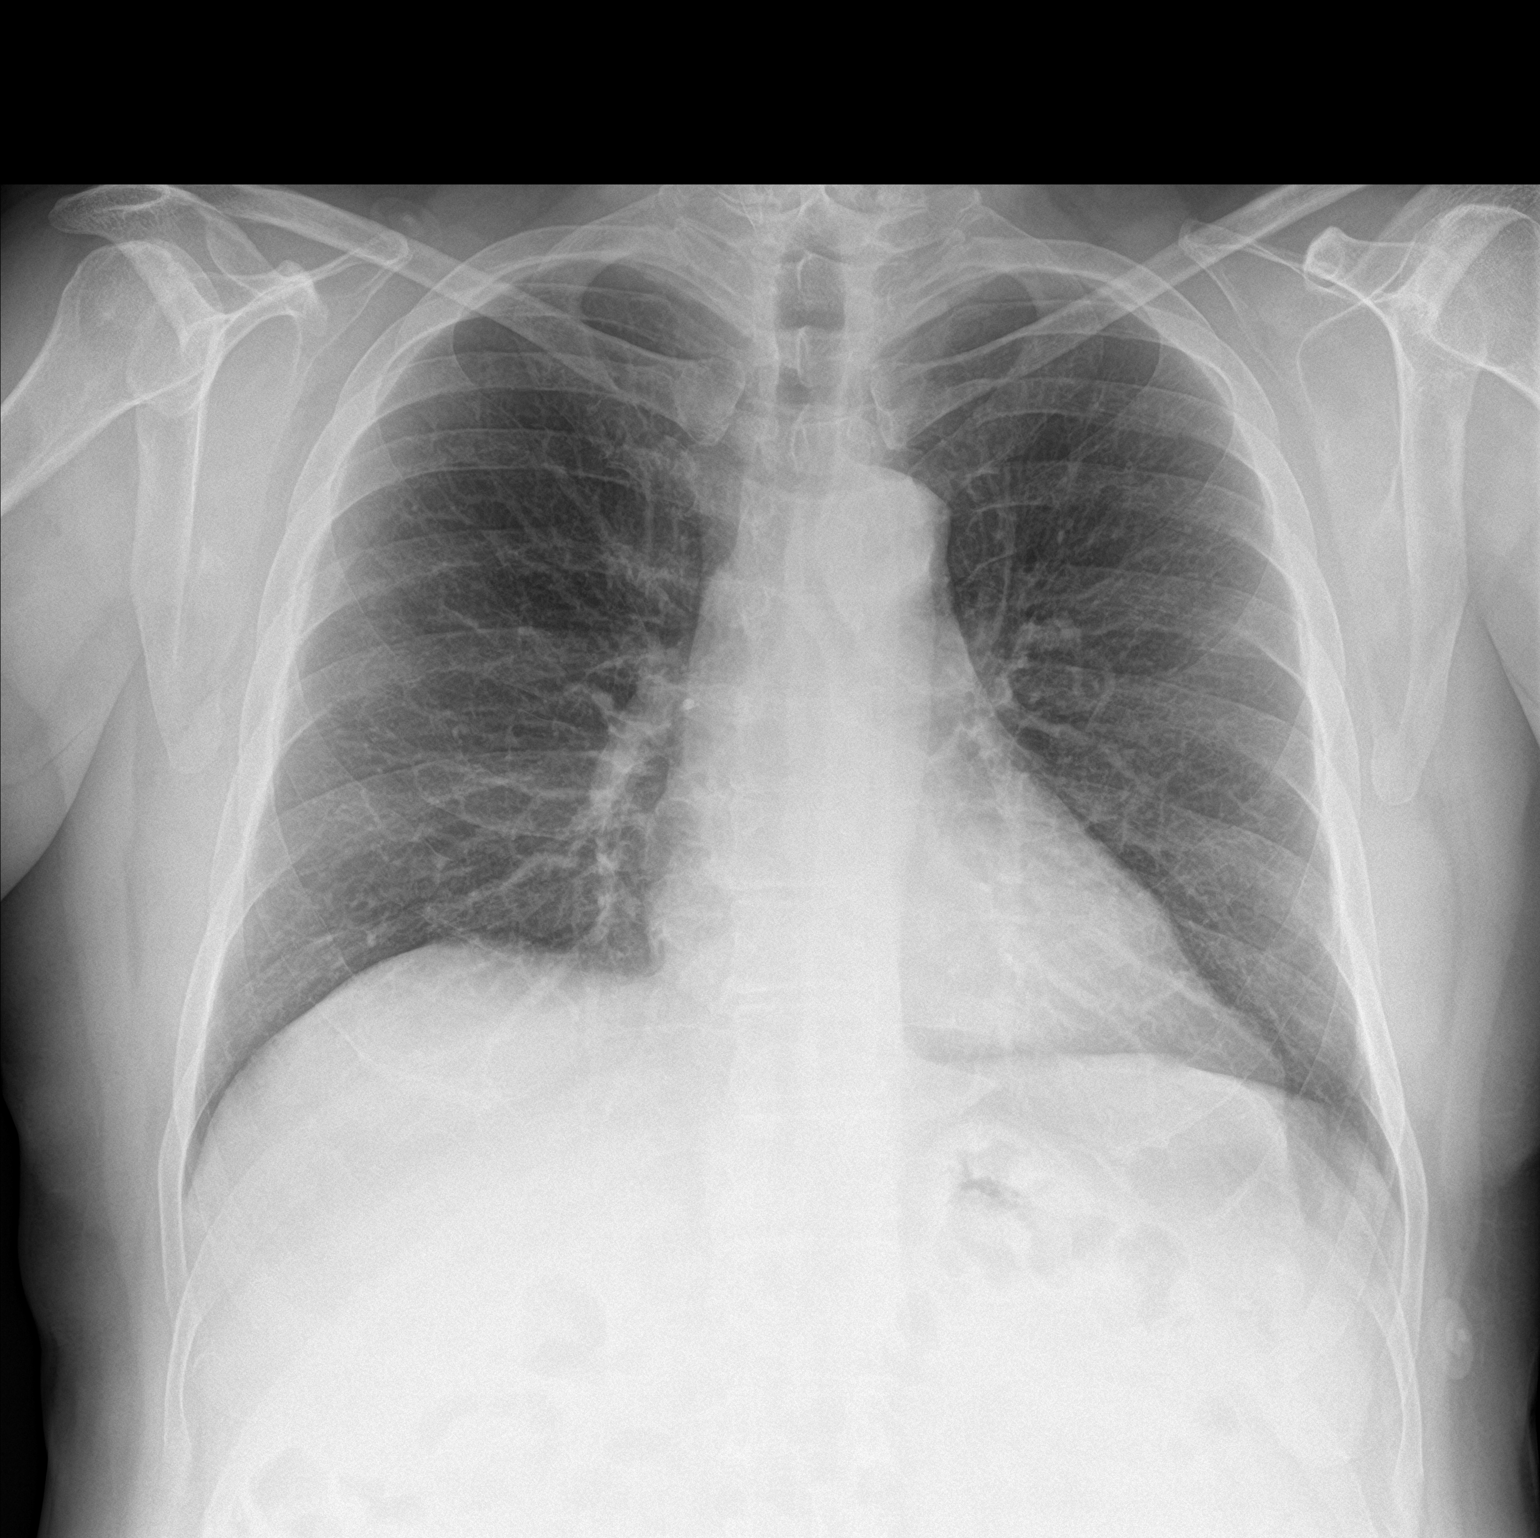

[chest lat]
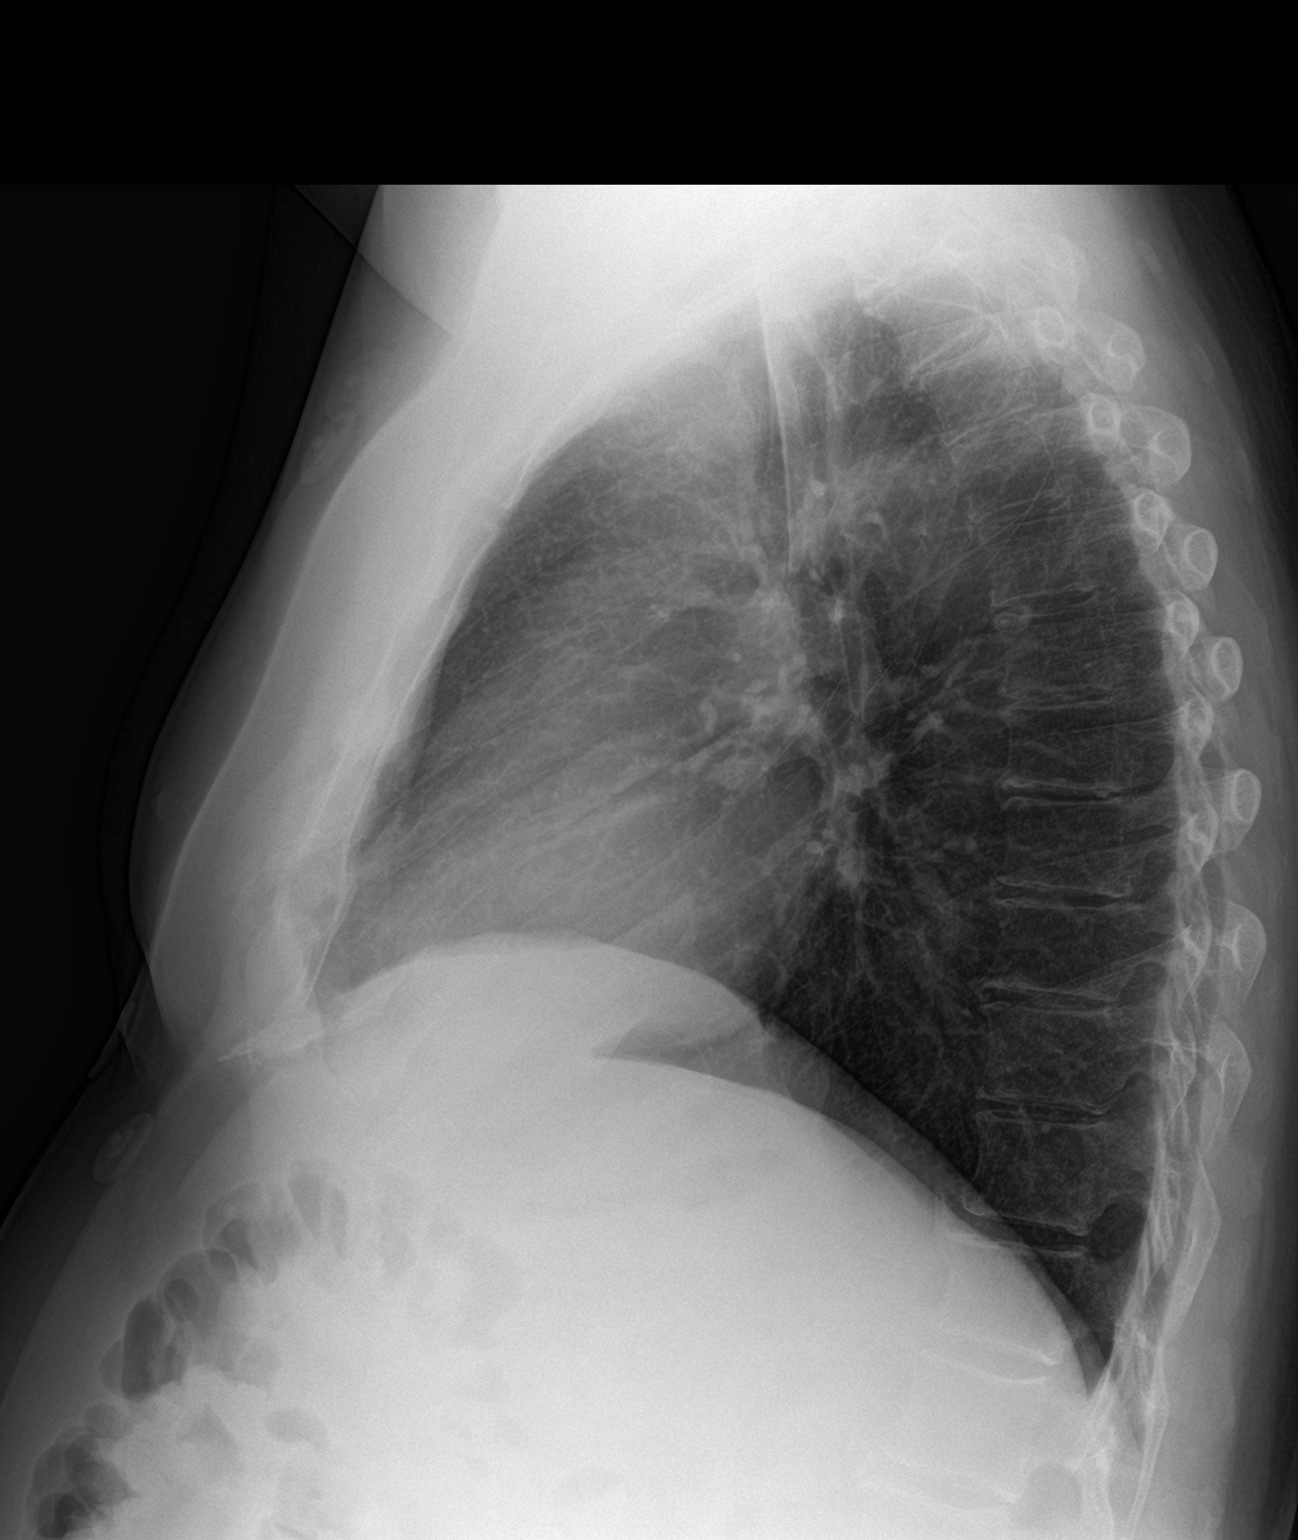

[2 of 2 positions shown; findings below may reference images not displayed]

FINDINGS: The cardiac silhouette, mediastinal and hilar contours are within
normal limits. The lungs are clear. No pleural effusion. No
pulmonary lesions. The bony thorax is intact.
IMPRESSION: No acute cardiopulmonary findings.
